# Patient Record
Sex: Female | Born: 1980 | Race: White | Hispanic: No | Marital: Married | Smoking: Never smoker
Health system: Southern US, Community
[De-identification: ages and names within clinical notes are randomized; demographics above are authoritative.]

## PROBLEM LIST (undated history)

## (undated) DIAGNOSIS — G5600 Carpal tunnel syndrome, unspecified upper limb: Secondary | ICD-10-CM

## (undated) DIAGNOSIS — F32A Depression, unspecified: Secondary | ICD-10-CM

## (undated) DIAGNOSIS — F329 Major depressive disorder, single episode, unspecified: Secondary | ICD-10-CM

## (undated) DIAGNOSIS — M779 Enthesopathy, unspecified: Secondary | ICD-10-CM

## (undated) DIAGNOSIS — M4300 Spondylolysis, site unspecified: Secondary | ICD-10-CM

## (undated) DIAGNOSIS — S0291XA Unspecified fracture of skull, initial encounter for closed fracture: Secondary | ICD-10-CM

## (undated) DIAGNOSIS — G43909 Migraine, unspecified, not intractable, without status migrainosus: Secondary | ICD-10-CM

## (undated) DIAGNOSIS — M791 Myalgia, unspecified site: Secondary | ICD-10-CM

## (undated) DIAGNOSIS — N200 Calculus of kidney: Secondary | ICD-10-CM

## (undated) DIAGNOSIS — F419 Anxiety disorder, unspecified: Secondary | ICD-10-CM

## (undated) DIAGNOSIS — G629 Polyneuropathy, unspecified: Secondary | ICD-10-CM

## (undated) DIAGNOSIS — M797 Fibromyalgia: Secondary | ICD-10-CM

## (undated) DIAGNOSIS — M5136 Other intervertebral disc degeneration, lumbar region: Secondary | ICD-10-CM

---

## 2015-11-17 ENCOUNTER — Emergency Department (HOSPITAL_COMMUNITY)
Admission: EM | Admit: 2015-11-17 | Discharge: 2015-11-17 | Disposition: A | Discharge status: Home / Self Care | Payer: Medicaid Other | Attending: Emergency Medicine | Admitting: Emergency Medicine

## 2015-11-17 ENCOUNTER — Encounter (HOSPITAL_COMMUNITY): Payer: Self-pay | Admitting: *Deleted

## 2015-11-17 DIAGNOSIS — M25562 Pain in left knee: Secondary | ICD-10-CM | POA: Insufficient documentation

## 2015-11-17 DIAGNOSIS — F329 Major depressive disorder, single episode, unspecified: Secondary | ICD-10-CM | POA: Diagnosis not present

## 2015-11-17 DIAGNOSIS — M79605 Pain in left leg: Secondary | ICD-10-CM | POA: Diagnosis present

## 2015-11-17 HISTORY — DX: Carpal tunnel syndrome, unspecified upper limb: G56.00

## 2015-11-17 HISTORY — DX: Enthesopathy, unspecified: M77.9

## 2015-11-17 HISTORY — DX: Other intervertebral disc degeneration, lumbar region: M51.36

## 2015-11-17 HISTORY — DX: Myalgia, unspecified site: M79.10

## 2015-11-17 HISTORY — DX: Fibromyalgia: M79.7

## 2015-11-17 HISTORY — DX: Major depressive disorder, single episode, unspecified: F32.9

## 2015-11-17 HISTORY — DX: Polyneuropathy, unspecified: G62.9

## 2015-11-17 HISTORY — DX: Migraine, unspecified, not intractable, without status migrainosus: G43.909

## 2015-11-17 HISTORY — DX: Depression, unspecified: F32.A

## 2015-11-17 HISTORY — DX: Spondylolysis, site unspecified: M43.00

## 2015-11-17 HISTORY — DX: Anxiety disorder, unspecified: F41.9

## 2015-11-17 MED ORDER — PREDNISONE 20 MG PO TABS: ORAL_TABLET | ORAL | Status: AC

## 2015-11-17 NOTE — Discharge Instructions (Signed)
Please follow up with your primary care provider for further management of your condition.  Take steroids as prescribed.  Return to the ER if you develop fever, numbness, rash on your leg or if you have other concerns.

## 2015-11-17 NOTE — ED Notes (Signed)
Pt states that she has neuropathy and fibromyalgia; pt states that she has had increased pain to her left leg for 2 days; pt states that she has taken her muscle relaxers, ibuprofen, gabapentin and lorazepam without relief; pt states that she has burning pain from her hip to her knee; pt states that the pain is constant but has been worse over the last 2 days and she states "I can't get it to calm down"; pt denies injury to leg; no redness no swelling noted

## 2015-11-17 NOTE — ED Provider Notes (Signed)
CSN: [id number]     Arrival date & time 11/17/15  2000 History  By signing my name below, I, Mackenzie Sawyer, attest that this documentation has been prepared under the direction and in the presence of Fayrene HelperBowie Benz Vandenberghe, PA-C Electronically Signed: Soijett Sawyer, ED Scribe. 11/17/2015. 8:28 PM.  Chief Complaint  Patient presents with  . Leg Pain      The history is provided by the patient. No language interpreter was used.    Mackenzie Sawyer is a 35 y.o. female with a medical hx of fibromyalgia, neuropathy, who presents to the Emergency Department complaining of constant left leg pain onset 1 week worsening 2 days ago. She notes that she will have flare ups of her fibromyalgia and neuropathy that she is unable to get to stop sometimes. Pt states that she has a burning sensation to her left thigh that radiates from her left hip to her left toes. Pt reports that she thinks that there is fluid to her left knee. Pt reports that her pain is more in her nerves and her pain is worsened with movement. Pt states that when her fibromyalgia and neuropathy flares up she goes to her doctor she is given a steroid and oxycodone for her symptoms. Pt is having associated symptoms of migraine x yesterday and vomiting. She notes that she has tried rest, ice, ibuprofen, tylenol, gabapentin, lorazepam, with no relief of her symptoms. She denies fever, bowel/bladder incontinence, and any other symptoms. Denies having a PCP at this time but has an appointment with a PCP on 5/31/017. Denies PMHx of IV drug use or CA.    Past Medical History  Diagnosis Date  . Fibromyalgia   . Neuropathy (HCC)   . Depression   . DDD (degenerative disc disease), lumbar   . Spondylolysis   . Anxiety   . Tendonitis   . Carpal tunnel syndrome   . Migraines   . Muscle discomfort     to neck   History reviewed. No pertinent past surgical history. No family history on file. Social History  Substance Use Topics  . Smoking status: Never Smoker    . Smokeless tobacco: None  . Alcohol Use: No   OB History    No data available     Review of Systems  Constitutional: Negative for fever.  Gastrointestinal:       No bowel incontinence  Genitourinary:       No bladder incontinence  Musculoskeletal: Positive for arthralgias. Negative for joint swelling.  Skin: Negative for color change and wound.  All other systems reviewed and are negative.     Allergies  Vancomycin and Tramadol  Home Medications   Prior to Admission medications   Not on File   BP 128/73 mmHg  Pulse 99  Temp(Src) 98.3 F (36.8 C) (Oral)  Resp 18  SpO2 99%  LMP 10/29/2015 Physical Exam  Constitutional: She is oriented to person, place, and time. She appears well-developed and well-nourished. No distress.  HENT:  Head: Normocephalic and atraumatic.  Eyes: EOM are normal.  Neck: Neck supple.  Cardiovascular: Normal rate.   Pulmonary/Chest: Effort normal. No respiratory distress.  Abdominal: She exhibits no distension.  Musculoskeletal: Normal range of motion.  No significant midline spinal tenderness, crepitus, or step-offs. BLE without palpable cords, edema, or erythema. Tenderness noted to the left knee at medial and lateral joint line. No crepitus or deformity. No significant swelling. Able to ambulate without difficulty or assistance.   Neurological: She is alert and oriented  to person, place, and time.  Patellar DTRs intact bilaterally.  Skin: Skin is warm and dry.  Psychiatric: She has a normal mood and affect. Her behavior is normal.  Nursing note and vitals reviewed.   ED Course  Procedures (including critical care time) DIAGNOSTIC STUDIES: Oxygen Saturation is 99% on RA, nl by my interpretation.    COORDINATION OF CARE: 8:26 PM Discussed treatment plan with pt at bedside which includes prednisone and pt agreed to plan.    MDM   Final diagnoses:  Left leg pain   BP 128/73 mmHg  Pulse 99  Temp(Src) 98.3 F (36.8 C) (Oral)   Resp 18  SpO2 99%  LMP 10/29/2015   Acute on chronic leg pain. No red flags. Low suspicion for DVT. Pt is NVI. No signs to suggest septic arthritis. Will Rx short course of steroid and pt will follow up with her PCP next week as previously scheduled for further care.   I personally performed the services described in this documentation, which was scribed in my presence. The recorded information has been reviewed and is accurate.      Fayrene Helper, PA-C 11/17/15 2033  Linwood Dibbles, MD 11/18/15 [phone]

## 2016-04-03 ENCOUNTER — Encounter (HOSPITAL_COMMUNITY): Payer: Self-pay | Admitting: Emergency Medicine

## 2016-04-03 ENCOUNTER — Emergency Department (HOSPITAL_COMMUNITY): Payer: Medicaid Other

## 2016-04-03 ENCOUNTER — Emergency Department (HOSPITAL_COMMUNITY)
Admission: EM | Admit: 2016-04-03 | Discharge: 2016-04-03 | Disposition: A | Discharge status: Home / Self Care | Payer: Medicaid Other | Attending: Emergency Medicine | Admitting: Emergency Medicine

## 2016-04-03 DIAGNOSIS — N134 Hydroureter: Secondary | ICD-10-CM | POA: Diagnosis not present

## 2016-04-03 DIAGNOSIS — Z79899 Other long term (current) drug therapy: Secondary | ICD-10-CM | POA: Diagnosis not present

## 2016-04-03 DIAGNOSIS — N200 Calculus of kidney: Secondary | ICD-10-CM | POA: Diagnosis not present

## 2016-04-03 DIAGNOSIS — R1031 Right lower quadrant pain: Secondary | ICD-10-CM | POA: Diagnosis present

## 2016-04-03 LAB — CBC
HEMATOCRIT: 39.7 % (ref 36.0–46.0)
HEMOGLOBIN: 13.8 g/dL (ref 12.0–15.0)
MCH: 29.4 pg (ref 26.0–34.0)
MCHC: 34.8 g/dL (ref 30.0–36.0)
MCV: 84.6 fL (ref 78.0–100.0)
Platelets: 271 10*3/uL (ref 150–400)
RBC: 4.69 MIL/uL (ref 3.87–5.11)
RDW: 12.8 % (ref 11.5–15.5)
WBC: 11.7 10*3/uL — AB (ref 4.0–10.5)

## 2016-04-03 LAB — URINALYSIS, ROUTINE W REFLEX MICROSCOPIC
Bilirubin Urine: NEGATIVE
Glucose, UA: NEGATIVE mg/dL
Ketones, ur: NEGATIVE mg/dL
NITRITE: NEGATIVE
PH: 6 (ref 5.0–8.0)
Protein, ur: NEGATIVE mg/dL
SPECIFIC GRAVITY, URINE: 1.024 (ref 1.005–1.030)

## 2016-04-03 LAB — COMPREHENSIVE METABOLIC PANEL
ALBUMIN: 4.7 g/dL (ref 3.5–5.0)
ALT: 19 U/L (ref 14–54)
ANION GAP: 9 (ref 5–15)
AST: 21 U/L (ref 15–41)
Alkaline Phosphatase: 50 U/L (ref 38–126)
BILIRUBIN TOTAL: 0.8 mg/dL (ref 0.3–1.2)
BUN: 18 mg/dL (ref 6–20)
CHLORIDE: 108 mmol/L (ref 101–111)
CO2: 21 mmol/L — ABNORMAL LOW (ref 22–32)
Calcium: 9.6 mg/dL (ref 8.9–10.3)
Creatinine, Ser: 0.76 mg/dL (ref 0.44–1.00)
GFR calc Af Amer: >60 mL/min (ref >60)
GFR calc non Af Amer: >60 mL/min (ref >60)
GLUCOSE: 93 mg/dL (ref 65–99)
POTASSIUM: 2.9 mmol/L — AB (ref 3.5–5.1)
Sodium: 138 mmol/L (ref 135–145)
TOTAL PROTEIN: 7.9 g/dL (ref 6.5–8.1)

## 2016-04-03 LAB — LIPASE, BLOOD: LIPASE: 26 U/L (ref 11–51)

## 2016-04-03 LAB — POC URINE PREG, ED: Preg Test, Ur: NEGATIVE

## 2016-04-03 LAB — URINE MICROSCOPIC-ADD ON

## 2016-04-03 MED ORDER — POTASSIUM CHLORIDE CRYS ER 20 MEQ PO TBCR
40.0000 meq | EXTENDED_RELEASE_TABLET | Freq: Once | ORAL | Status: AC
  Administered 2016-04-03: 40 meq via ORAL
  Filled 2016-04-03: qty 2

## 2016-04-03 MED ORDER — IOPAMIDOL (ISOVUE-300) INJECTION 61%: 100.0000 mL | Freq: Once | INTRAVENOUS | Status: DC | PRN

## 2016-04-03 MED ORDER — IBUPROFEN 600 MG PO TABS: 600.0000 mg | ORAL_TABLET | Freq: Four times a day (QID) | ORAL | 0 refills | Status: AC | PRN

## 2016-04-03 MED ORDER — MORPHINE SULFATE (PF) 4 MG/ML IV SOLN
4.0000 mg | Freq: Once | INTRAVENOUS | Status: AC
  Administered 2016-04-03: 4 mg via INTRAVENOUS
  Filled 2016-04-03: qty 1

## 2016-04-03 MED ORDER — ONDANSETRON 4 MG PO TBDP: 4.0000 mg | ORAL_TABLET | ORAL | 0 refills | Status: AC | PRN

## 2016-04-03 MED ORDER — OXYCODONE-ACETAMINOPHEN 5-325 MG PO TABS: 2.0000 | ORAL_TABLET | ORAL | 0 refills | Status: AC | PRN

## 2016-04-03 MED ORDER — IOPAMIDOL (ISOVUE-300) INJECTION 61%
100.0000 mL | Freq: Once | INTRAVENOUS | Status: AC | PRN
  Administered 2016-04-03: 100 mL via INTRAVENOUS

## 2016-04-03 MED ORDER — ONDANSETRON HCL 4 MG/2ML IJ SOLN
4.0000 mg | Freq: Once | INTRAMUSCULAR | Status: AC
  Administered 2016-04-03: 4 mg via INTRAVENOUS
  Filled 2016-04-03: qty 2

## 2016-04-03 MED ORDER — TAMSULOSIN HCL 0.4 MG PO CAPS
0.4000 mg | ORAL_CAPSULE | Freq: Once | ORAL | Status: AC
  Administered 2016-04-03: 0.4 mg via ORAL
  Filled 2016-04-03: qty 1

## 2016-04-03 MED ORDER — SODIUM CHLORIDE 0.9 % IV SOLN
INTRAVENOUS | Status: DC
  Administered 2016-04-03: 20:00:00 via INTRAVENOUS

## 2016-04-03 MED ORDER — TAMSULOSIN HCL 0.4 MG PO CAPS: 0.4000 mg | ORAL_CAPSULE | Freq: Every day | ORAL | 0 refills | Status: AC

## 2016-04-03 MED ORDER — OXYCODONE-ACETAMINOPHEN 5-325 MG PO TABS
2.0000 | ORAL_TABLET | Freq: Once | ORAL | Status: AC
  Administered 2016-04-03: 2 via ORAL
  Filled 2016-04-03: qty 2

## 2016-04-03 NOTE — ED Provider Notes (Signed)
[facility]-EMERGENCY DEPT Provider Note   CSN: [id number] Arrival date & time: 04/03/16  1750     History   Chief Complaint Chief Complaint  Patient presents with  . Abdominal Pain    HPI Mackenzie Sawyer is a 35 y.o. female.  HPI Right lower quadrant pain since 1 AM. Aching and cramping in nature. Worse with movements. Radiation into her lower back. Patient reports she spent the day lying in a ball. He reports vomiting 2. No constipation. No pain burning or urgency of urination. She reports her urine has been dark. No abnormal vaginal bleeding or discharge. Past Medical History:  Diagnosis Date  . Anxiety   . Carpal tunnel syndrome   . DDD (degenerative disc disease), lumbar   . Depression   . Fibromyalgia   . Migraines   . Muscle discomfort    to neck  . Neuropathy (HCC)   . Spondylolysis   . Tendonitis     There are no active problems to display for this patient.   History reviewed. No pertinent surgical history.  OB History    No data available       Home Medications    Prior to Admission medications   Medication Sig Start Date End Date Taking? Authorizing Provider  ADVAIR DISKUS 250-50 MCG/DOSE AEPB Inhale 1 puff into the lungs 2 (two) times daily as needed for shortness of breath or wheezing. 02/23/16  Yes Historical Provider, MD  buPROPion (WELLBUTRIN SR) 200 MG 12 hr tablet Take 200 mg by mouth 2 (two) times daily. 03/05/16  Yes Historical Provider, MD  DULoxetine (CYMBALTA) 60 MG capsule Take 60 mg by mouth at bedtime. 03/07/16  Yes Historical Provider, MD  LORazepam (ATIVAN) 0.5 MG tablet Take 0.5 mg by mouth 3 (three) times daily as needed for anxiety. 03/07/16  Yes Historical Provider, MD  LYRICA 75 MG capsule Take 75 mg by mouth 3 (three) times daily.  03/07/16  Yes Historical Provider, MD  meloxicam (MOBIC) 7.5 MG tablet Take 7.5 mg by mouth 2 (two) times daily.   Yes Historical Provider, MD  predniSONE (DELTASONE) 20 MG tablet Take 20 mg by mouth at bedtime.    Yes Historical Provider, MD  PROAIR HFA 108 [phone] Base) MCG/ACT inhaler Inhale 2 puffs into the lungs every 6 (six) hours as needed for shortness of breath or wheezing. 03/07/16  Yes Historical Provider, MD  SUMAtriptan (IMITREX) 25 MG tablet Take 25 mg by mouth 4 (four) times daily as needed for migraine (may repeat after 2 hours of initial dose). Max 4 tabs/24 hours 01/29/16  Yes Historical Provider, MD  tiZANidine (ZANAFLEX) 4 MG tablet Take 4 mg by mouth at bedtime.  03/07/16  Yes Historical Provider, MD  topiramate (TOPAMAX) 100 MG tablet Take 100 mg by mouth 2 (two) times daily.   Yes Historical Provider, MD  ibuprofen (ADVIL,MOTRIN) 600 MG tablet Take 1 tablet (600 mg total) by mouth every 6 (six) hours as needed. 04/03/16   Arby Barrette, MD  ondansetron (ZOFRAN ODT) 4 MG disintegrating tablet Take 1 tablet (4 mg total) by mouth every 4 (four) hours as needed for nausea or vomiting. 04/03/16   Arby Barrette, MD  oxyCODONE-acetaminophen (PERCOCET) 5-325 MG tablet Take 2 tablets by mouth every 4 (four) hours as needed. 04/03/16   Arby Barrette, MD  predniSONE (DELTASONE) 20 MG tablet 3 tabs po day one, then 2 tabs daily x 4 days Patient not taking: Reported on 04/03/2016 11/17/15   Fayrene Helper, PA-C  tamsulosin (  FLOMAX) 0.4 MG CAPS capsule Take 1 capsule (0.4 mg total) by mouth daily. 04/03/16   Arby Barrette, MD    Family History History reviewed. No pertinent family history.  Social History Social History  Substance Use Topics  . Smoking status: Never Smoker  . Smokeless tobacco: Never Used  . Alcohol use No     Allergies   Baclofen; Vancomycin; and Tramadol   Review of Systems Review of Systems 10 Systems reviewed and are negative for acute change except as noted in the HPI.   Physical Exam Updated Vital Signs BP 121/79 (BP Location: Right Arm)   Pulse 88   Temp 98.4 F (36.9 C) (Oral)   Resp 17   Ht 5\' 4"  (1.626 m)   Wt 183 lb (83 kg)   LMP 03/13/2016 Comment: Upreg  neg 04/03/16  SpO2 99%   BMI 31.41 kg/m   Physical Exam  Constitutional: She appears well-developed and well-nourished. No distress.  HENT:  Head: Normocephalic and atraumatic.  Mouth/Throat: Oropharynx is clear and moist.  Eyes: Conjunctivae and EOM are normal.  Neck: Neck supple.  Cardiovascular: Normal rate and regular rhythm.   No murmur heard. Pulmonary/Chest: Effort normal and breath sounds normal. No respiratory distress.  Abdominal: Soft. There is tenderness.  Right lower and mid quadrant tenderness to palpation. No guarding.  Musculoskeletal: She exhibits no edema, tenderness or deformity.  Neurological: She is alert. No cranial nerve deficit. She exhibits normal muscle tone. Coordination normal.  Skin: Skin is warm and dry.  Psychiatric: She has a normal mood and affect.  Nursing note and vitals reviewed.    ED Treatments / Results  Labs (all labs ordered are listed, but only abnormal results are displayed) Labs Reviewed  COMPREHENSIVE METABOLIC PANEL - Abnormal; Notable for the following:       Result Value   Potassium 2.9 (*)    CO2 21 (*)    All other components within normal limits  CBC - Abnormal; Notable for the following:    WBC 11.7 (*)    All other components within normal limits  URINALYSIS, ROUTINE W REFLEX MICROSCOPIC (NOT AT [facility]) - Abnormal; Notable for the following:    APPearance CLOUDY (*)    Hgb urine dipstick MODERATE (*)    Leukocytes, UA TRACE (*)    All other components within normal limits  URINE MICROSCOPIC-ADD ON - Abnormal; Notable for the following:    Squamous Epithelial / LPF 0-5 (*)    Bacteria, UA RARE (*)    All other components within normal limits  LIPASE, BLOOD  POC URINE PREG, ED    EKG  EKG Interpretation None       Radiology Ct Abdomen Pelvis W Contrast  Result Date: 04/03/2016 CLINICAL DATA:  Acute onset of right upper quadrant abdominal pain and vomiting. Initial encounter. EXAM: CT ABDOMEN AND PELVIS WITH  CONTRAST TECHNIQUE: Multidetector CT imaging of the abdomen and pelvis was performed using the standard protocol following bolus administration of intravenous contrast. CONTRAST:  ISOVUE-300 IOPAMIDOL (ISOVUE-300) INJECTION 61% COMPARISON:  None. FINDINGS: Lower chest: Minimal bibasilar atelectasis is noted. The visualized portions of the mediastinum are unremarkable Hepatobiliary: The liver is unremarkable in appearance. The gallbladder is unremarkable in appearance. The common bile duct remains normal in caliber. Pancreas: The pancreas is within normal limits. Spleen: The spleen is unremarkable in appearance. Adrenals/Urinary Tract: The adrenal glands are unremarkable in appearance. There is moderate to severe right-sided hydronephrosis, with diffuse dilatation of the right ureter. This  reflects a large obstructing 9 x 6 mm stone at the distal right ureter, just above the right vesicoureteral junction. No perinephric stranding is seen. No nonobstructing renal stones are identified. The left kidney is unremarkable in appearance. Stomach/Bowel: The stomach is unremarkable in appearance. The small bowel is within normal limits. The appendix is normal in caliber, without evidence of appendicitis. The colon is unremarkable in appearance. Vascular/Lymphatic: The abdominal aorta is unremarkable in appearance. The inferior vena cava is grossly unremarkable. No retroperitoneal lymphadenopathy is seen. No pelvic sidewall lymphadenopathy is identified. Reproductive: The bladder is decompressed and not well assessed. The uterus is unremarkable in appearance. The ovaries are relatively symmetric. No suspicious adnexal masses are seen. Other: No additional soft tissue abnormalities are seen. Musculoskeletal: No acute osseous abnormalities are identified. The visualized musculature is unremarkable in appearance. IMPRESSION: Moderate to severe right-sided hydronephrosis, with diffuse dilatation of the right ureter. This  reflects a large obstructing 9 x 6 mm stone at the distal right ureter, just above the right vesicoureteral junction. Electronically Signed   By: Roanna RaiderJeffery  Chang M.D.   On: 04/03/2016 20:06    Procedures Procedures (including critical care time)  Medications Ordered in ED Medications  iopamidol (ISOVUE-300) 61 % injection 100 mL (not administered)  0.9 %  sodium chloride infusion ( Intravenous New Bag/Given 04/03/16 2028)  iopamidol (ISOVUE-300) 61 % injection 100 mL (100 mLs Intravenous Contrast Given 04/03/16 1924)  potassium chloride SA (K-DUR,KLOR-CON) CR tablet 40 mEq (40 mEq Oral Given 04/03/16 2018)  morphine 4 MG/ML injection 4 mg (4 mg Intravenous Given 04/03/16 2028)  oxyCODONE-acetaminophen (PERCOCET/ROXICET) 5-325 MG per tablet 2 tablet (2 tablets Oral Given 04/03/16 2125)  tamsulosin (FLOMAX) capsule 0.4 mg (0.4 mg Oral Given 04/03/16 2125)  ondansetron (ZOFRAN) injection 4 mg (4 mg Intravenous Given 04/03/16 2126)     Initial Impression / Assessment and Plan / ED Course  I have reviewed the triage vital signs and the nursing notes.  Pertinent labs & imaging results that were available during my care of the patient were reviewed by me and considered in my medical decision making (see chart for details).  Clinical Course   Consult: Reviewed with Dr. Thea SilversmithMackenzie of urology. He reviewed CT and felt that the stone would possibly pass spontaneously. He recommends for pain control and the patient to strain urine.  Final Clinical Impressions(s) / ED Diagnoses   Final diagnoses:  Kidney stone  Hydroureter on right  Patient developed fairly sudden onset of right lateral and lower abdominal pain. CT confirms kidney stone with hydronephrosis. Patient has adequate pain control emergency department with morphine and oral Percocet. She has not had any further vomiting. She feels comfortable taking pain medications at home and tried to pass stone. She is aware follow-up plan and return  precautions.  New Prescriptions New Prescriptions   IBUPROFEN (ADVIL,MOTRIN) 600 MG TABLET    Take 1 tablet (600 mg total) by mouth every 6 (six) hours as needed.   ONDANSETRON (ZOFRAN ODT) 4 MG DISINTEGRATING TABLET    Take 1 tablet (4 mg total) by mouth every 4 (four) hours as needed for nausea or vomiting.   OXYCODONE-ACETAMINOPHEN (PERCOCET) 5-325 MG TABLET    Take 2 tablets by mouth every 4 (four) hours as needed.   TAMSULOSIN (FLOMAX) 0.4 MG CAPS CAPSULE    Take 1 capsule (0.4 mg total) by mouth daily.     Arby BarretteMarcy Brycelyn Gambino, MD 04/03/16 2225

## 2016-04-03 NOTE — ED Notes (Signed)
In Ct  

## 2016-04-03 NOTE — ED Notes (Signed)
PT treated for UTI about 3-4 weeks ago and now having severe abd pain in RLQ that started about 0100 this am and pt sts having N/V denies diarrhea.  Pt alert and oriented.  Pt sts pain is worse when moving around.

## 2016-04-03 NOTE — ED Triage Notes (Signed)
Pt reports sharp RUQ pain that increases with movement x1 day. Pt reports vomiting but denies diarrhea and fevers.

## 2016-04-03 NOTE — ED Notes (Signed)
provided a strainer and cup per provider request  edu pt to return to Ed if she experiences fever, vomiting and intractable pain  Pt verbalized understanding

## 2016-04-10 ENCOUNTER — Emergency Department (HOSPITAL_COMMUNITY)
Admission: EM | Admit: 2016-04-10 | Discharge: 2016-04-10 | Disposition: A | Discharge status: Home / Self Care | Payer: Medicaid Other | Attending: Emergency Medicine | Admitting: Emergency Medicine

## 2016-04-10 ENCOUNTER — Emergency Department (HOSPITAL_COMMUNITY): Payer: Medicaid Other

## 2016-04-10 ENCOUNTER — Encounter (HOSPITAL_COMMUNITY): Payer: Self-pay

## 2016-04-10 DIAGNOSIS — N202 Calculus of kidney with calculus of ureter: Secondary | ICD-10-CM | POA: Diagnosis not present

## 2016-04-10 DIAGNOSIS — Z79899 Other long term (current) drug therapy: Secondary | ICD-10-CM | POA: Diagnosis not present

## 2016-04-10 DIAGNOSIS — R109 Unspecified abdominal pain: Secondary | ICD-10-CM | POA: Diagnosis present

## 2016-04-10 DIAGNOSIS — N200 Calculus of kidney: Secondary | ICD-10-CM

## 2016-04-10 HISTORY — DX: Calculus of kidney: N20.0

## 2016-04-10 HISTORY — DX: Unspecified fracture of skull, initial encounter for closed fracture: S02.91XA

## 2016-04-10 LAB — URINALYSIS, ROUTINE W REFLEX MICROSCOPIC
BILIRUBIN URINE: NEGATIVE
GLUCOSE, UA: NEGATIVE mg/dL
KETONES UR: NEGATIVE mg/dL
NITRITE: NEGATIVE
PH: 6.5 (ref 5.0–8.0)
Protein, ur: NEGATIVE mg/dL
Specific Gravity, Urine: 1.02 (ref 1.005–1.030)

## 2016-04-10 LAB — URINE MICROSCOPIC-ADD ON

## 2016-04-10 LAB — POC URINE PREG, ED: PREG TEST UR: NEGATIVE

## 2016-04-10 MED ORDER — ONDANSETRON HCL 4 MG/2ML IJ SOLN
4.0000 mg | Freq: Once | INTRAMUSCULAR | Status: AC
  Administered 2016-04-10: 4 mg via INTRAVENOUS
  Filled 2016-04-10: qty 2

## 2016-04-10 MED ORDER — HYDROMORPHONE HCL 1 MG/ML IJ SOLN
1.0000 mg | Freq: Once | INTRAMUSCULAR | Status: AC
  Administered 2016-04-10: 1 mg via INTRAVENOUS
  Filled 2016-04-10: qty 1

## 2016-04-10 NOTE — ED Triage Notes (Signed)
PT C/O CONTINUED RIGHT FLANK PAIN RADIATING TO THE RLQ SINCE LAST Thursday. PT STS SHE WAS DX'D WITH A KIDNEY STONE LAST Thursday, BUT THE PAIN HAS GOTTEN WORSE. PT STS SHE DOESN'T THINK SHE CAN PASS IT ON HER OWN. DENIES FEVER OR CHILLS.

## 2016-04-10 NOTE — ED Provider Notes (Signed)
[facility]-EMERGENCY DEPT Provider Note   CSN: [id number] Arrival date & time: 04/10/16  1757     History   Chief Complaint Chief Complaint  Patient presents with  . Flank Pain    RIGHT    HPI Mackenzie Sawyer is a 35 y.o. female.  HPI   35 year old female with history of fibromyalgia, anxiety, kidney stone presenting with complaints of right flank pain. For the past week patient has had pain to her right flank. She was seen and evaluated 7 days ago for her pain. CT scan demonstrating a 9 x 6 mm that was large at the right distal ureter just above the UVJ. Urologist was consult at that time and pt was certainly discharge with pain medication and Flomax. Patient continues to endorse same pain that has not improved. Her pain is worsen, radiates towards the right groin. She endorse nausea with occasional vomiting due to the pain. She denies any fever, chills, burning urination or hematuria. She did attempt to contact her urologist to try to set up an appointment had to cancel because it was on the date she had other appointments. Patient fell she is having a difficult time passing the stone and decided to come here for further evaluation. She has been taking the pain medication and Flomax which provide some relief. She does have a pain specialist.  Past Medical History:  Diagnosis Date  . Anxiety   . Carpal tunnel syndrome   . DDD (degenerative disc disease), lumbar   . Depression   . Fibromyalgia   . Kidney stone   . Migraines   . Muscle discomfort    to neck  . Neuropathy (HCC)   . Skull fracture (HCC)   . Spondylolysis   . Tendonitis     There are no active problems to display for this patient.   History reviewed. No pertinent surgical history.  OB History    No data available       Home Medications    Prior to Admission medications   Medication Sig Start Date End Date Taking? Authorizing Provider  ADVAIR DISKUS 250-50 MCG/DOSE AEPB Inhale 1 puff into the lungs 2 (two)  times daily as needed for shortness of breath or wheezing. 02/23/16  Yes Historical Provider, MD  buPROPion (WELLBUTRIN SR) 200 MG 12 hr tablet Take 200 mg by mouth 2 (two) times daily. 03/05/16  Yes Historical Provider, MD  DULoxetine (CYMBALTA) 60 MG capsule Take 60 mg by mouth daily.  03/07/16  Yes Historical Provider, MD  LORazepam (ATIVAN) 0.5 MG tablet Take 0.5 mg by mouth 3 (three) times daily as needed for anxiety. 03/07/16  Yes Historical Provider, MD  LYRICA 75 MG capsule Take 75 mg by mouth 3 (three) times daily.  03/07/16  Yes Historical Provider, MD  meloxicam (MOBIC) 7.5 MG tablet Take 7.5 mg by mouth 2 (two) times daily.   Yes Historical Provider, MD  oxyCODONE-acetaminophen (PERCOCET) 5-325 MG tablet Take 2 tablets by mouth every 4 (four) hours as needed. Patient taking differently: Take 2 tablets by mouth every 4 (four) hours as needed for moderate pain.  04/03/16  Yes Arby Barrette, MD  PROAIR HFA 108 [phone] Base) MCG/ACT inhaler Inhale 2 puffs into the lungs every 6 (six) hours as needed for shortness of breath or wheezing. 03/07/16  Yes Historical Provider, MD  SUMAtriptan (IMITREX) 25 MG tablet Take 25 mg by mouth 4 (four) times daily as needed for migraine (may repeat after 2 hours of initial dose). Max 4  tabs/24 hours 01/29/16  Yes Historical Provider, MD  tamsulosin (FLOMAX) 0.4 MG CAPS capsule Take 1 capsule (0.4 mg total) by mouth daily. 04/03/16  Yes Arby BarretteMarcy Pfeiffer, MD  tiZANidine (ZANAFLEX) 4 MG tablet Take 4 mg by mouth at bedtime.  03/07/16  Yes Historical Provider, MD  topiramate (TOPAMAX) 100 MG tablet Take 100 mg by mouth 2 (two) times daily.   Yes Historical Provider, MD  ibuprofen (ADVIL,MOTRIN) 600 MG tablet Take 1 tablet (600 mg total) by mouth every 6 (six) hours as needed. Patient not taking: Reported on 04/10/2016 04/03/16   Arby BarretteMarcy Pfeiffer, MD  ondansetron (ZOFRAN ODT) 4 MG disintegrating tablet Take 1 tablet (4 mg total) by mouth every 4 (four) hours as needed for nausea  or vomiting. Patient not taking: Reported on 04/10/2016 04/03/16   Arby BarretteMarcy Pfeiffer, MD  predniSONE (DELTASONE) 20 MG tablet 3 tabs po day one, then 2 tabs daily x 4 days Patient not taking: Reported on 04/03/2016 11/17/15   Fayrene HelperBowie Stacee Earp, PA-C    Family History History reviewed. No pertinent family history.  Social History Social History  Substance Use Topics  . Smoking status: Never Smoker  . Smokeless tobacco: Never Used  . Alcohol use No     Allergies   Baclofen; Vancomycin; and Tramadol   Review of Systems Review of Systems  All other systems reviewed and are negative.    Physical Exam Updated Vital Signs BP 106/77 (BP Location: Left Arm)   Pulse (!) 123   Temp 98.3 F (36.8 C) (Oral)   Resp 20   Ht 5\' 4"  (1.626 m)   Wt 81.6 kg   LMP 03/13/2016 Comment: Upreg neg 04/03/16  SpO2 99%   BMI 30.90 kg/m   Physical Exam  Constitutional: She appears well-developed and well-nourished. No distress.  HENT:  Head: Atraumatic.  Eyes: Conjunctivae are normal.  Neck: Neck supple.  Cardiovascular:  Tachycardia without murmurs rubs or gallops  Pulmonary/Chest: Effort normal and breath sounds normal.  Abdominal: Soft. There is tenderness (Tenderness to right side abdomen on palpation without guarding or rebound tenderness.).  Genitourinary:  Genitourinary Comments: Right CVA tenderness  Neurological: She is alert.  Skin: No rash noted.  Psychiatric: She has a normal mood and affect.  Nursing note and vitals reviewed.    ED Treatments / Results  Labs (all labs ordered are listed, but only abnormal results are displayed) Labs Reviewed  URINALYSIS, ROUTINE W REFLEX MICROSCOPIC (NOT AT [facility]) - Abnormal; Notable for the following:       Result Value   APPearance CLOUDY (*)    Hgb urine dipstick MODERATE (*)    Leukocytes, UA TRACE (*)    All other components within normal limits  URINE MICROSCOPIC-ADD ON - Abnormal; Notable for the following:    Squamous Epithelial /  LPF 0-5 (*)    Bacteria, UA FEW (*)    All other components within normal limits  POC URINE PREG, ED    EKG  EKG Interpretation None       Radiology Ct Renal Stone Study  Result Date: 04/10/2016 CLINICAL DATA:  Persistent right flank and lower quadrant pain for 1 week. Followup right ureteral calculus and hydronephrosis. EXAM: CT ABDOMEN AND PELVIS WITHOUT CONTRAST TECHNIQUE: Multidetector CT imaging of the abdomen and pelvis was performed following the standard protocol without IV contrast. COMPARISON:  04/03/2016 FINDINGS: Lower chest: No acute findings. Hepatobiliary: No mass visualized on this unenhanced exam. Mild hepatic steatosis again noted. Gallbladder is unremarkable. Pancreas: No mass  or inflammatory process visualized on this unenhanced exam. Spleen:  Within normal limits in size. Adrenals/Urinary tract: Normal adrenal glands. Several nonobstructive calculi again seen in mid and lower pole of right kidney. Severe right hydroureteronephrosis is again demonstrated with at least 2 calculi in the distal right ureter near the ureterovesical junction, largest measuring 6 mm. This shows no significant change in position since previous study. No bladder calculi identified. Stomach/Bowel: No evidence of obstruction, inflammatory process, or abnormal fluid collections. Normal appendix visualized. Vascular/Lymphatic: No pathologically enlarged lymph nodes identified. No evidence of abdominal aortic aneurysm. Reproductive:  No mass or other significant abnormality. Other:  None. Musculoskeletal:  No suspicious bone lesions identified. IMPRESSION: No significant change in severe right hydroureteronephrosis with at least 2 distal right ureteral calculi near the ureterovesical junction, largest measuring 6 mm. Right nephrolithiasis. Mild hepatic steatosis. Electronically Signed   By: Myles Rosenthal M.D.   On: 04/10/2016 20:51    Procedures Procedures (including critical care time)  Medications  Ordered in ED Medications - No data to display   Initial Impression / Assessment and Plan / ED Course  I have reviewed the triage vital signs and the nursing notes.  Pertinent labs & imaging results that were available during my care of the patient were reviewed by me and considered in my medical decision making (see chart for details).  Clinical Course    BP 103/69 (BP Location: Left Arm)   Pulse 88   Temp 98.5 F (36.9 C) (Oral)   Resp 18   Ht 5\' 4"  (1.626 m)   Wt 81.6 kg   LMP 03/13/2016 Comment: Upreg neg 04/03/16  SpO2 95%   BMI 30.90 kg/m    Final Clinical Impressions(s) / ED Diagnoses   Final diagnoses:  Kidney stone on right side    New Prescriptions New Prescriptions   No medications on file   7:43 PM Patient was diagnosed with a 9 x 6 mm kidney stone in the right side at the distal ureter above the UVJ approximately 1 week ago. She is here with persistent pain to the same location which has worsened and now spreading towards her abdomen. Suspect pain is from the retained stone. We'll repeat CT scan to assess the stone location. Pain medication given. Her UA did not show any signs of urinary tract infection. She is tachycardic likely secondary to pain. Pain medication given.  9:03 PM Repeat renal stone study demonstrates at least 2 ureteral stone in the R UVJ with severe hydroneprosis, appears to be in the same location.  I did discuss this finding with oncall urologist, Dr. Vernie Ammons, who recommend pt to call this office tomorrow for close follow up.  I will provide additional pain medication and will discharge pt appropriately.  Return precaution discussed.    Fayrene Helper, PA-C 04/10/16 2108    Vanetta Mulders, MD 04/11/16 2220

## 2016-04-10 NOTE — ED Notes (Signed)
Pt ambulatory and independent at discharge.  Verbalized understanding of discharge instructions 

## 2016-04-10 NOTE — Discharge Instructions (Signed)
Please call [facility] tomorrow for close follow up

## 2016-04-11 ENCOUNTER — Emergency Department (HOSPITAL_COMMUNITY)
Admission: EM | Admit: 2016-04-11 | Discharge: 2016-04-12 | Disposition: A | Discharge status: Home / Self Care | Payer: Medicaid Other | Attending: Emergency Medicine | Admitting: Emergency Medicine

## 2016-04-11 ENCOUNTER — Encounter (HOSPITAL_COMMUNITY): Payer: Self-pay | Admitting: Emergency Medicine

## 2016-04-11 DIAGNOSIS — N390 Urinary tract infection, site not specified: Secondary | ICD-10-CM | POA: Diagnosis not present

## 2016-04-11 DIAGNOSIS — Z79899 Other long term (current) drug therapy: Secondary | ICD-10-CM | POA: Insufficient documentation

## 2016-04-11 DIAGNOSIS — N200 Calculus of kidney: Secondary | ICD-10-CM | POA: Insufficient documentation

## 2016-04-11 LAB — URINALYSIS, ROUTINE W REFLEX MICROSCOPIC
BILIRUBIN URINE: NEGATIVE
Glucose, UA: NEGATIVE mg/dL
KETONES UR: NEGATIVE mg/dL
Nitrite: NEGATIVE
PROTEIN: 30 mg/dL — AB
SPECIFIC GRAVITY, URINE: 1.018 (ref 1.005–1.030)
pH: 6.5 (ref 5.0–8.0)

## 2016-04-11 LAB — URINE MICROSCOPIC-ADD ON

## 2016-04-11 MED ORDER — SULFAMETHOXAZOLE-TRIMETHOPRIM 800-160 MG PO TABS
1.0000 | ORAL_TABLET | Freq: Once | ORAL | Status: AC
  Administered 2016-04-11: 1 via ORAL
  Filled 2016-04-11: qty 1

## 2016-04-11 MED ORDER — ONDANSETRON 4 MG PO TBDP
4.0000 mg | ORAL_TABLET | Freq: Once | ORAL | Status: AC
  Administered 2016-04-11: 4 mg via ORAL
  Filled 2016-04-11: qty 1

## 2016-04-11 MED ORDER — HYDROMORPHONE HCL 1 MG/ML IJ SOLN
1.0000 mg | Freq: Once | INTRAMUSCULAR | Status: AC
  Administered 2016-04-11: 1 mg via INTRAMUSCULAR
  Filled 2016-04-11: qty 1

## 2016-04-11 MED ORDER — PHENAZOPYRIDINE HCL 100 MG PO TABS
100.0000 mg | ORAL_TABLET | Freq: Three times a day (TID) | ORAL | Status: DC
  Filled 2016-04-11: qty 1

## 2016-04-11 NOTE — Progress Notes (Signed)
Patient confirms her pcp is Dr. Greggory StallionGeorge Onsi-Bonsu.  System updated.

## 2016-04-11 NOTE — ED Triage Notes (Signed)
Pt was evaluated last night for increasing pain of a kidney stone.  Tonight around 1920 she states she passed half of a kidney stone.  After she passed it she said she has not been able to urinate since.  Patient moaning in pain during triage.

## 2016-04-11 NOTE — ED Provider Notes (Signed)
[facility]-EMERGENCY DEPT Provider Note   CSN: [id number] Arrival date & time: 04/11/16  1934  By signing my name below, I, Suzan Slick. Elon Spanner, attest that this documentation has been prepared under the direction and in the presence of Earley Favor, FNP.  Electronically Signed: Suzan Slick. Elon Spanner, ED Scribe. 04/11/16. 8:28 PM.   History   Chief Complaint Chief Complaint  Patient presents with  . Kidney Stone Pain    The history is provided by the patient. No language interpreter was used.    HPI Comments: Mackenzie Sawyer is a 35 y.o. female without any pertinent past medical history who presents to the Emergency Department complaining of intermittent, unchanged lower abdominal pain onset this evening. Pt states at approximately 1920 she noted she passed a known kidney stone. After passing stone, she states she has been unable to void. No aggravating or alleviating factors reported at this time. Prescribed Oxycodone and Flomax attempted at home for discomfort without any long term improvement. No recent fever or chills. Last normal menstrual period 9/21.  Pt was recently evaluated in the Emergency Department on 10/17 and 10/19 for similar. At time of initial visit, CT scan performed which revealed a 9 x 6 mm kidney stone with hydronephrosis. Pt was safely discharged home with instructions for pain control. However, pt returned 2 days later as pain did not improve. CT scan was repeated which revealed 2 uretal stones, however, was in same location. Pain was treated in the Emergency Department and she safely discharged home. Close follow up with Urologist recommended after time of discharge.    PCP: Jackie Plum, MD    Past Medical History:  Diagnosis Date  . Anxiety   . Carpal tunnel syndrome   . DDD (degenerative disc disease), lumbar   . Depression   . Fibromyalgia   . Kidney stone   . Migraines   . Muscle discomfort    to neck  . Neuropathy (HCC)   . Skull fracture (HCC)   . Spondylolysis    . Tendonitis     There are no active problems to display for this patient.   History reviewed. No pertinent surgical history.  OB History    No data available       Home Medications    Prior to Admission medications   Medication Sig Start Date End Date Taking? Authorizing Provider  ADVAIR DISKUS 250-50 MCG/DOSE AEPB Inhale 1 puff into the lungs 2 (two) times daily as needed for shortness of breath or wheezing. 02/23/16  Yes Historical Provider, MD  buPROPion (WELLBUTRIN SR) 200 MG 12 hr tablet Take 200 mg by mouth 2 (two) times daily. 03/05/16  Yes Historical Provider, MD  DULoxetine (CYMBALTA) 60 MG capsule Take 60 mg by mouth daily.  03/07/16  Yes Historical Provider, MD  LYRICA 75 MG capsule Take 75 mg by mouth 3 (three) times daily.  03/07/16  Yes Historical Provider, MD  oxyCODONE-acetaminophen (PERCOCET) 5-325 MG tablet Take 2 tablets by mouth every 4 (four) hours as needed. Patient taking differently: Take 2 tablets by mouth every 4 (four) hours as needed for moderate pain.  04/03/16  Yes Arby Barrette, MD  tamsulosin (FLOMAX) 0.4 MG CAPS capsule Take 1 capsule (0.4 mg total) by mouth daily. 04/03/16  Yes Arby Barrette, MD  tiZANidine (ZANAFLEX) 4 MG tablet Take 4 mg by mouth at bedtime.  03/07/16  Yes Historical Provider, MD  topiramate (TOPAMAX) 100 MG tablet Take 100 mg by mouth 2 (two) times daily.   Yes Historical  Provider, MD  ibuprofen (ADVIL,MOTRIN) 600 MG tablet Take 1 tablet (600 mg total) by mouth every 6 (six) hours as needed. Patient not taking: Reported on 04/11/2016 04/03/16   Arby BarretteMarcy Pfeiffer, MD  LORazepam (ATIVAN) 0.5 MG tablet Take 0.5 mg by mouth 3 (three) times daily as needed for anxiety. 03/07/16   Historical Provider, MD  ondansetron (ZOFRAN ODT) 4 MG disintegrating tablet Take 1 tablet (4 mg total) by mouth every 4 (four) hours as needed for nausea or vomiting. Patient not taking: Reported on 04/11/2016 04/03/16   Arby BarretteMarcy Pfeiffer, MD  phenazopyridine  (PYRIDIUM) 200 MG tablet Take 1 tablet (200 mg total) by mouth 3 (three) times daily. 04/12/16   Earley FavorGail Tandy Lewin, NP  predniSONE (DELTASONE) 20 MG tablet 3 tabs po day one, then 2 tabs daily x 4 days Patient not taking: Reported on 04/11/2016 11/17/15   Fayrene HelperBowie Tran, PA-C  PROAIR HFA 108 [phone](90 Base) MCG/ACT inhaler Inhale 2 puffs into the lungs every 6 (six) hours as needed for shortness of breath or wheezing. 03/07/16   Historical Provider, MD  sulfamethoxazole-trimethoprim (BACTRIM DS,SEPTRA DS) 800-160 MG tablet Take 1 tablet by mouth 2 (two) times daily. 04/12/16 04/19/16  Earley FavorGail Gram Siedlecki, NP  SUMAtriptan (IMITREX) 25 MG tablet Take 25 mg by mouth 4 (four) times daily as needed for migraine (may repeat after 2 hours of initial dose). Max 4 tabs/24 hours 01/29/16   Historical Provider, MD    Family History No family history on file.  Social History Social History  Substance Use Topics  . Smoking status: Never Smoker  . Smokeless tobacco: Never Used  . Alcohol use No     Allergies   Baclofen; Vancomycin; and Tramadol   Review of Systems Review of Systems  Constitutional: Negative for chills and fever.  Respiratory: Negative for shortness of breath.   Gastrointestinal: Positive for abdominal pain. Negative for nausea and vomiting.  Neurological: Negative for headaches.  Psychiatric/Behavioral: Negative for confusion.  All other systems reviewed and are negative.    Physical Exam Updated Vital Signs BP 109/79 (BP Location: Left Arm)   Pulse 94   Temp 98.6 F (37 C) (Oral)   Resp 18   Ht 5\' 4"  (1.626 m)   Wt 81.6 kg   LMP 03/13/2016 Comment: Upreg neg 04/03/16  SpO2 99%   BMI 30.90 kg/m   Physical Exam  Constitutional: She is oriented to person, place, and time. She appears well-developed and well-nourished. No distress.  HENT:  Head: Normocephalic and atraumatic.  Eyes: EOM are normal.  Neck: Normal range of motion.  Cardiovascular: Normal rate, regular rhythm and normal heart  sounds.   Pulmonary/Chest: Effort normal and breath sounds normal.  Abdominal: Soft. There is no tenderness.  Musculoskeletal: Normal range of motion.  Neurological: She is alert and oriented to person, place, and time.  Skin: Skin is warm and dry.  Psychiatric: She has a normal mood and affect. Judgment normal.  Nursing note and vitals reviewed.    ED Treatments / Results   DIAGNOSTIC STUDIES: Oxygen Saturation is 98% on RA, Normal by my interpretation.    COORDINATION OF CARE: 8:15 PM- Will order foley catheter placement with 100 CC flush. Will order urinalysis and treat pain with Dilaudid and Zofran. Discussed treatment plan with pt at bedside and pt agreed to plan.      Labs (all labs ordered are listed, but only abnormal results are displayed) Labs Reviewed  URINALYSIS, ROUTINE W REFLEX MICROSCOPIC (NOT AT [facility]) - Abnormal; Notable  for the following:       Result Value   Color, Urine AMBER (*)    APPearance CLOUDY (*)    Hgb urine dipstick LARGE (*)    Protein, ur 30 (*)    Leukocytes, UA SMALL (*)    All other components within normal limits  URINE MICROSCOPIC-ADD ON - Abnormal; Notable for the following:    Squamous Epithelial / LPF 0-5 (*)    Bacteria, UA MANY (*)    All other components within normal limits    EKG  EKG Interpretation None       Radiology Ct Renal Stone Study  Result Date: 04/10/2016 CLINICAL DATA:  Persistent right flank and lower quadrant pain for 1 week. Followup right ureteral calculus and hydronephrosis. EXAM: CT ABDOMEN AND PELVIS WITHOUT CONTRAST TECHNIQUE: Multidetector CT imaging of the abdomen and pelvis was performed following the standard protocol without IV contrast. COMPARISON:  04/03/2016 FINDINGS: Lower chest: No acute findings. Hepatobiliary: No mass visualized on this unenhanced exam. Mild hepatic steatosis again noted. Gallbladder is unremarkable. Pancreas: No mass or inflammatory process visualized on this unenhanced exam.  Spleen:  Within normal limits in size. Adrenals/Urinary tract: Normal adrenal glands. Several nonobstructive calculi again seen in mid and lower pole of right kidney. Severe right hydroureteronephrosis is again demonstrated with at least 2 calculi in the distal right ureter near the ureterovesical junction, largest measuring 6 mm. This shows no significant change in position since previous study. No bladder calculi identified. Stomach/Bowel: No evidence of obstruction, inflammatory process, or abnormal fluid collections. Normal appendix visualized. Vascular/Lymphatic: No pathologically enlarged lymph nodes identified. No evidence of abdominal aortic aneurysm. Reproductive:  No mass or other significant abnormality. Other:  None. Musculoskeletal:  No suspicious bone lesions identified. IMPRESSION: No significant change in severe right hydroureteronephrosis with at least 2 distal right ureteral calculi near the ureterovesical junction, largest measuring 6 mm. Right nephrolithiasis. Mild hepatic steatosis. Electronically Signed   By: Myles Rosenthal M.D.   On: 04/10/2016 20:51    Procedures Procedures (including critical care time)  Medications Ordered in ED Medications  phenazopyridine (PYRIDIUM) tablet 100 mg (not administered)  HYDROmorphone (DILAUDID) injection 1 mg (1 mg Intramuscular Given 04/11/16 2055)  ondansetron (ZOFRAN-ODT) disintegrating tablet 4 mg (4 mg Oral Given 04/11/16 2055)  sulfamethoxazole-trimethoprim (BACTRIM DS,SEPTRA DS) 800-160 MG per tablet 1 tablet (1 tablet Oral Given 04/11/16 2329)  oxyCODONE (Oxy IR/ROXICODONE) immediate release tablet 5 mg (5 mg Oral Given 04/12/16 0036)  ketorolac (TORADOL) injection 30 mg (30 mg Intramuscular Given 04/12/16 0037)     Initial Impression / Assessment and Plan / ED Course  I have reviewed the triage vital signs and the nursing notes.  Pertinent labs & imaging results that were available during my care of the patient were reviewed by me  and considered in my medical decision making (see chart for details).  Clinical Course    Reason kidney stone.  She says she felt pressure and pass the stone, but she feels like there is still a portion of it there.  She's tried pushing and pushing without any results, except to increase her abdominal pain. Others the nurse to place a Foley and irrigate possibly to move the stone around.  She is to obtain a urine sample prior to irrigation. Patient was found to have a urinary tract infection.  She is in started on Septra and Pyridium.  She's been given pain medication including oxycodone 5 mg by mouth as well as Toradol IM and  has had significant decrease in her pain.  She is able to urinate.  She has made contact with urology and is waiting for them to call back with an appointment for next week  Final Clinical Impressions(s) / ED Diagnoses   Final diagnoses:  Kidney stone  Urinary tract infection without hematuria, site unspecified    New Prescriptions New Prescriptions   PHENAZOPYRIDINE (PYRIDIUM) 200 MG TABLET    Take 1 tablet (200 mg total) by mouth 3 (three) times daily.   SULFAMETHOXAZOLE-TRIMETHOPRIM (BACTRIM DS,SEPTRA DS) 800-160 MG TABLET    Take 1 tablet by mouth 2 (two) times daily.   I personally performed the services described in this documentation, which was scribed in my presence. The recorded information has been reviewed and is accurate.   Earley Favor, NP 04/12/16 0147    Earley Favor, NP 04/12/16 1610    Lyndal Pulley, MD 04/12/16 9604

## 2016-04-11 NOTE — ED Notes (Signed)
Pt made aware of need for urine specimen 

## 2016-04-12 MED ORDER — OXYCODONE HCL 5 MG PO TABS
5.0000 mg | ORAL_TABLET | ORAL | Status: AC
  Administered 2016-04-12: 5 mg via ORAL
  Filled 2016-04-12: qty 1

## 2016-04-12 MED ORDER — KETOROLAC TROMETHAMINE 60 MG/2ML IM SOLN
30.0000 mg | INTRAMUSCULAR | Status: AC
  Administered 2016-04-12: 30 mg via INTRAMUSCULAR
  Filled 2016-04-12: qty 2

## 2016-04-12 MED ORDER — PHENAZOPYRIDINE HCL 200 MG PO TABS: 200.0000 mg | ORAL_TABLET | Freq: Three times a day (TID) | ORAL | 0 refills | Status: AC

## 2016-04-12 MED ORDER — SULFAMETHOXAZOLE-TRIMETHOPRIM 800-160 MG PO TABS: 1.0000 | ORAL_TABLET | Freq: Two times a day (BID) | ORAL | 0 refills | Status: AC

## 2016-04-12 NOTE — ED Notes (Signed)
folley d/c by charge nurse and pt urinated after.

## 2016-04-12 NOTE — Discharge Instructions (Signed)
You brought in a stone that you pass resistant significant insight 6 mm by 2 mm x 1 mm your assess for further discomfort.  He was found to have a urinary tract infection which is being treated with antibiotic.  It is very important that you take the antibiotic as directed until all tablets have been consumed.  You've also been given a medication called Pyridium, which does make your urine orange.  It is a bladder anesthetic and should help with the discomfort.  Continue taking Zofran and oxycodone at home for pain as needed.  Continue calling the urologist on a daily basis and continue to get an appointment.  Return anytime your pain becomes unbearable.  He developed fever, nausea, vomiting or worsening symptoms

## 2016-04-26 ENCOUNTER — Other Ambulatory Visit: Payer: Self-pay | Admitting: Family Medicine

## 2016-04-26 ENCOUNTER — Inpatient Hospital Stay (HOSPITAL_COMMUNITY): Admission: RE | Admit: 2016-04-26 | Payer: Medicaid Other | Source: Ambulatory Visit

## 2016-04-26 ENCOUNTER — Ambulatory Visit (HOSPITAL_COMMUNITY)
Admission: RE | Admit: 2016-04-26 | Discharge: 2016-04-26 | Disposition: A | Discharge status: Home / Self Care | Payer: Medicaid Other | Source: Ambulatory Visit | Attending: Family Medicine | Admitting: Family Medicine

## 2016-04-26 ENCOUNTER — Ambulatory Visit (HOSPITAL_COMMUNITY): Admission: RE | Admit: 2016-04-26 | Payer: Medicaid Other | Source: Ambulatory Visit

## 2016-04-26 DIAGNOSIS — G8929 Other chronic pain: Secondary | ICD-10-CM

## 2016-04-26 DIAGNOSIS — M545 Low back pain: Secondary | ICD-10-CM | POA: Insufficient documentation

## 2016-04-26 DIAGNOSIS — M542 Cervicalgia: Secondary | ICD-10-CM | POA: Insufficient documentation

## 2018-06-22 IMAGING — CT CT ABD-PELV W/ CM
2 of 4 series · 15 of 46 positions shown, 17 images · IV contrast (ISOVUE)
Comparison: None.

CLINICAL DATA: Acute onset of right upper quadrant abdominal pain
and vomiting. Initial encounter.

EXAM:
CT ABDOMEN AND PELVIS WITH CONTRAST
TECHNIQUE: Multidetector CT imaging of the abdomen and pelvis was performed
using the standard protocol following bolus administration of
intravenous contrast.
CONTRAST:  100mL [id number] IOPAMIDOL ([id number]) INJECTION 61%

[Series 2: abd/pel with · axial · 0.85mm/px · z∈[+388,+793]mm · 12 of 97 slices shown, 14 images]
[im 8/97  soft-tissue]
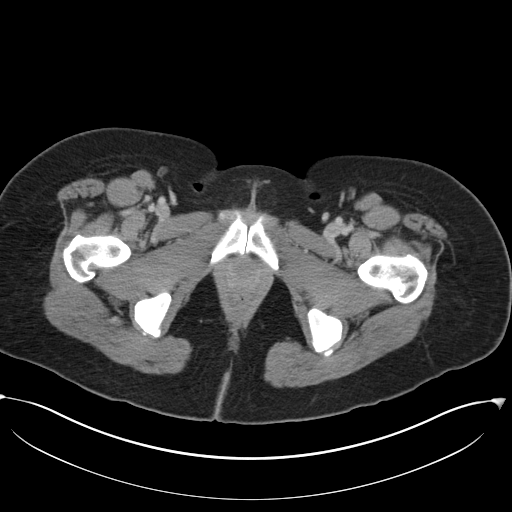
[im 8/97  bone]
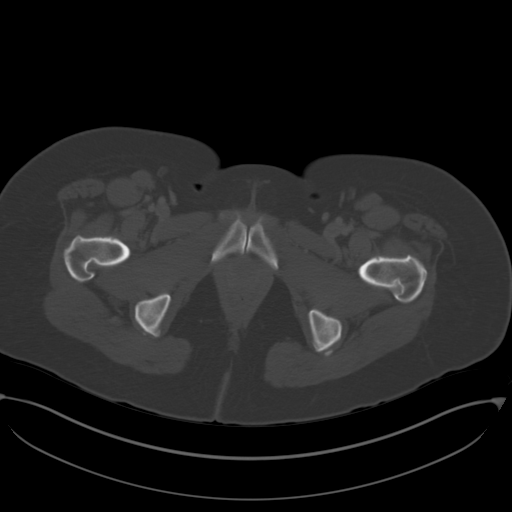
[im 15/97  soft-tissue]
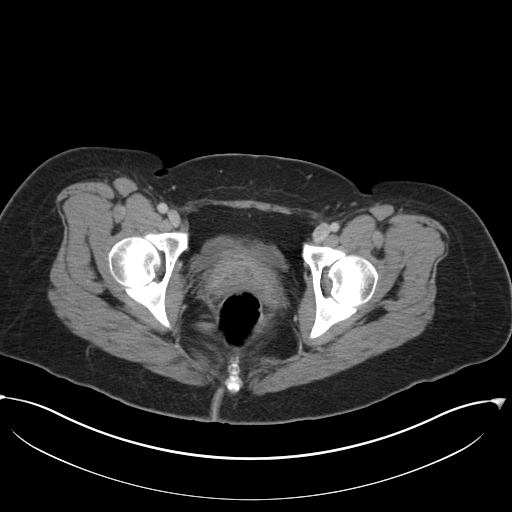
[im 23/97  soft-tissue]
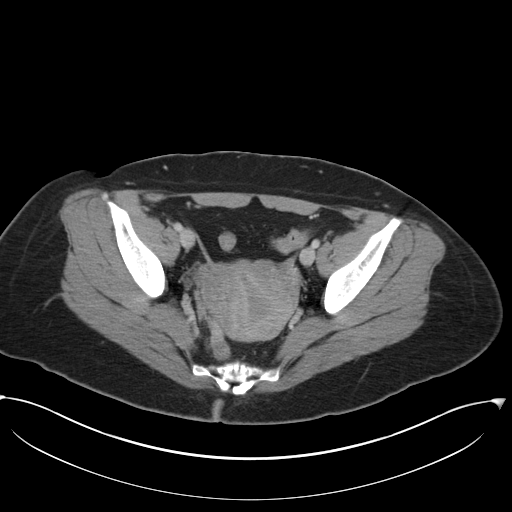
[im 30/97  soft-tissue]
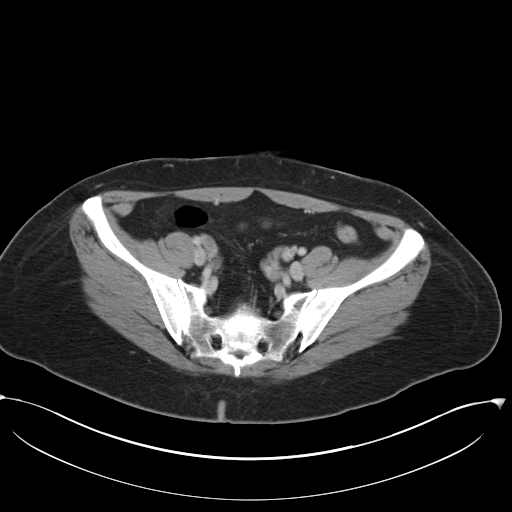
[im 37/97  soft-tissue]
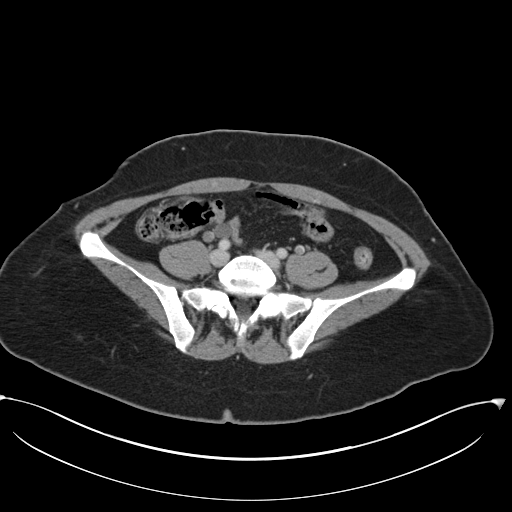
[im 45/97  soft-tissue]
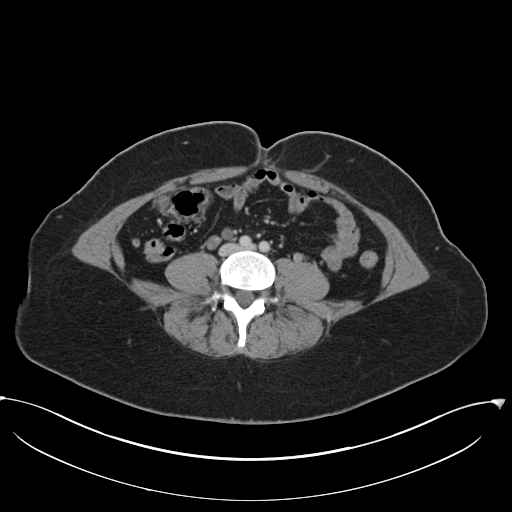
[im 52/97  soft-tissue]
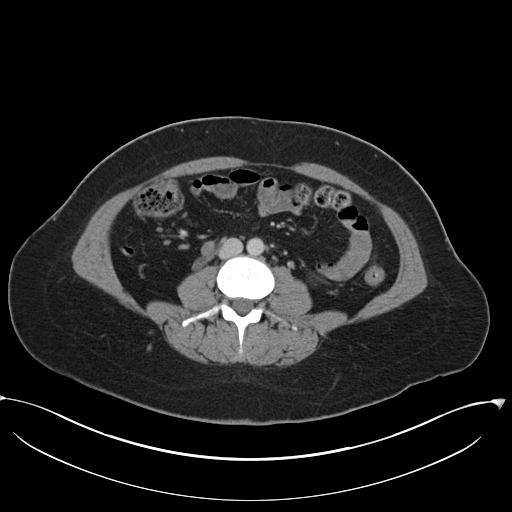
[im 60/97  soft-tissue]
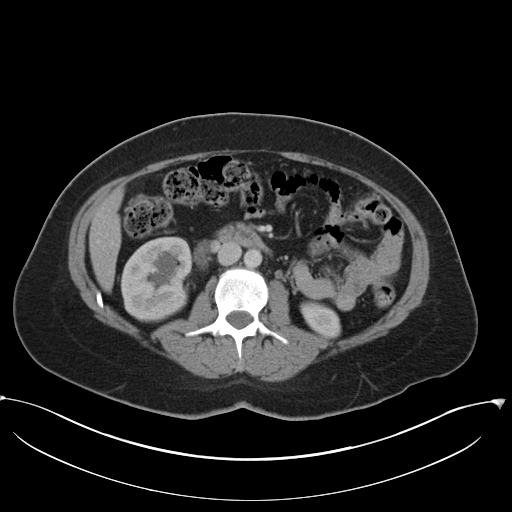
[im 67/97  soft-tissue]
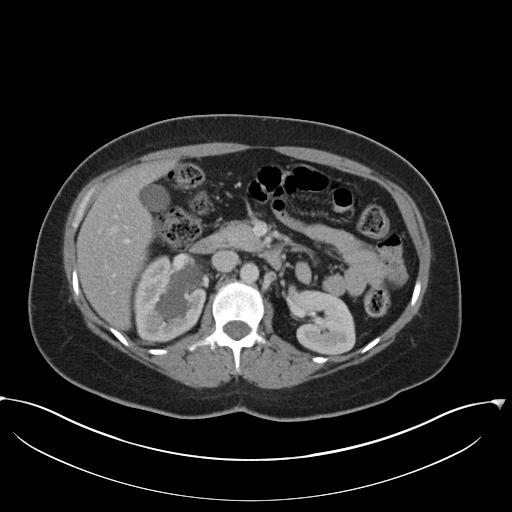
[im 67/97  bone]
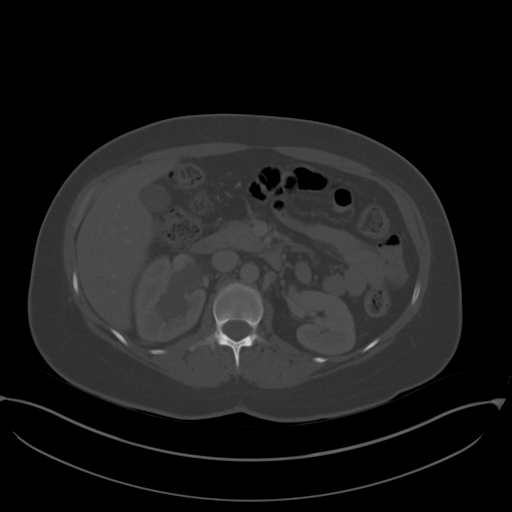
[im 74/97  soft-tissue]
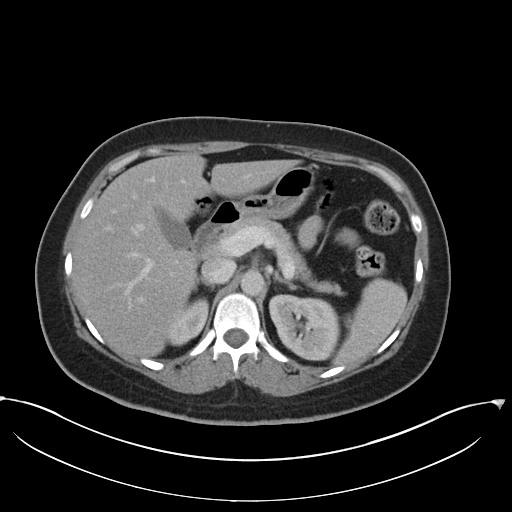
[im 82/97  soft-tissue]
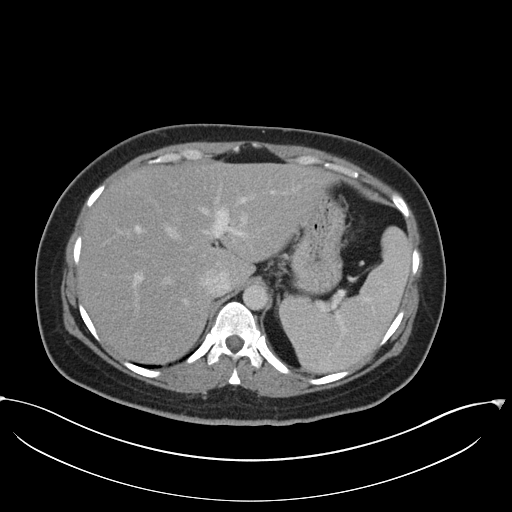
[im 89/97  soft-tissue]
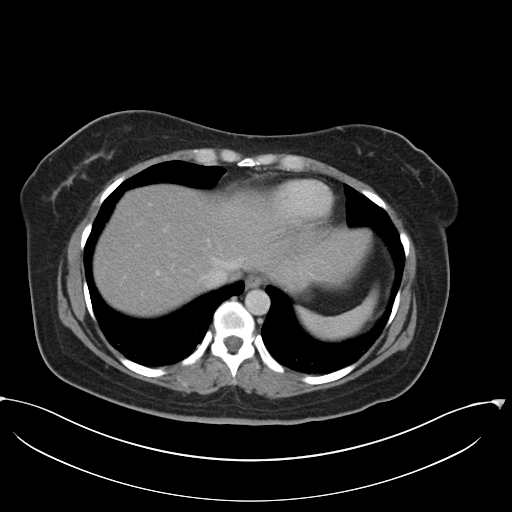

[Series 3: coronal a/|p · coronal · 0.74mm/px · 3 of 167 slices shown]
[im 56/167  soft-tissue]
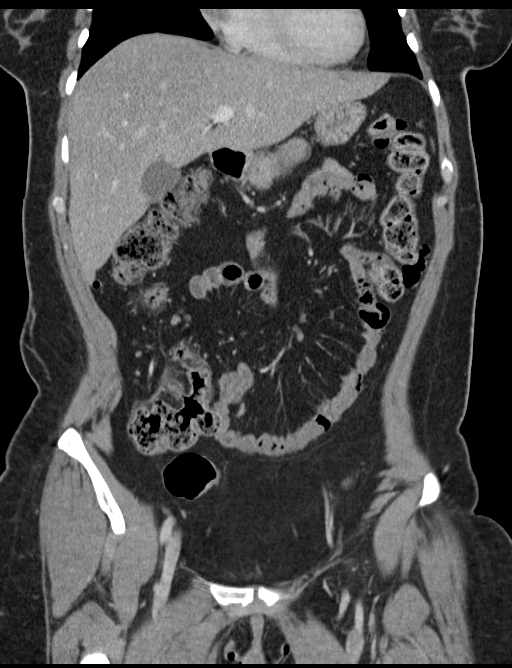
[im 74/167  soft-tissue]
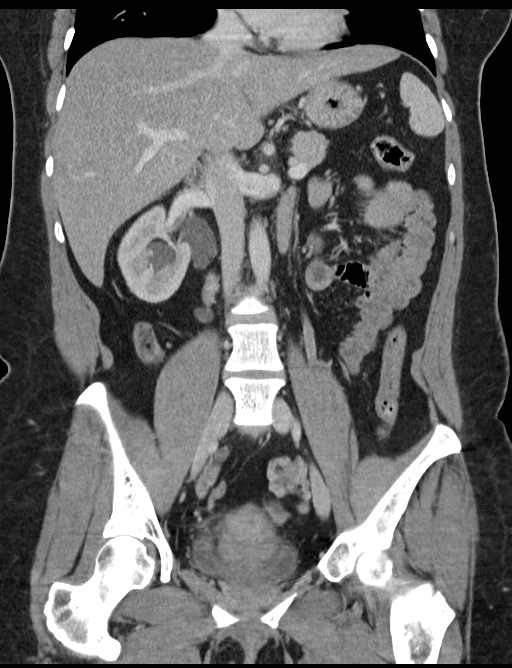
[im 93/167  soft-tissue]
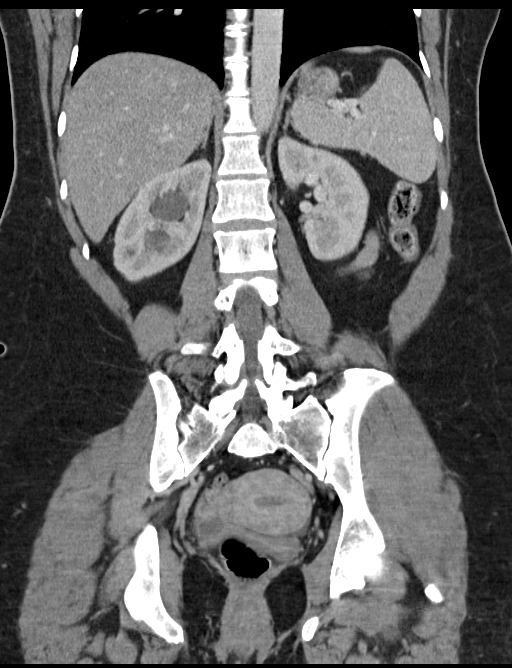

[15 of 46 positions shown; findings below may reference images not displayed]

FINDINGS: Lower chest: Minimal bibasilar atelectasis is noted. The visualized
portions of the mediastinum are unremarkable

Hepatobiliary: The liver is unremarkable in appearance. The
gallbladder is unremarkable in appearance. The common bile duct
remains normal in caliber.

Pancreas: The pancreas is within normal limits.

Spleen: The spleen is unremarkable in appearance.

Adrenals/Urinary Tract: The adrenal glands are unremarkable in
appearance.

There is moderate to severe right-sided hydronephrosis, with diffuse
dilatation of the right ureter. This reflects a large obstructing 9
x 6 mm stone at the distal right ureter, just above the right
vesicoureteral junction.

No perinephric stranding is seen. No nonobstructing renal stones are
identified. The left kidney is unremarkable in appearance.

Stomach/Bowel: The stomach is unremarkable in appearance. The small
bowel is within normal limits. The appendix is normal in caliber,
without evidence of appendicitis. The colon is unremarkable in
appearance.

Vascular/Lymphatic: The abdominal aorta is unremarkable in
appearance. The inferior vena cava is grossly unremarkable. No
retroperitoneal lymphadenopathy is seen. No pelvic sidewall
lymphadenopathy is identified.

Reproductive: The bladder is decompressed and not well assessed.

The uterus is unremarkable in appearance. The ovaries are relatively
symmetric. No suspicious adnexal masses are seen.

Other: No additional soft tissue abnormalities are seen.

Musculoskeletal: No acute osseous abnormalities are identified. The
visualized musculature is unremarkable in appearance.
IMPRESSION: Moderate to severe right-sided hydronephrosis, with diffuse
dilatation of the right ureter. This reflects a large obstructing 9
x 6 mm stone at the distal right ureter, just above the right
vesicoureteral junction.

## 2018-07-15 IMAGING — DX DG LUMBAR SPINE 2-3V
3 series · 3 of 3 positions shown · non-contrast
Comparison: None.

CLINICAL DATA: Low back pain, no known injury, initial encounter

EXAM:
LUMBAR SPINE - 3 VIEW

[l-spine ap]
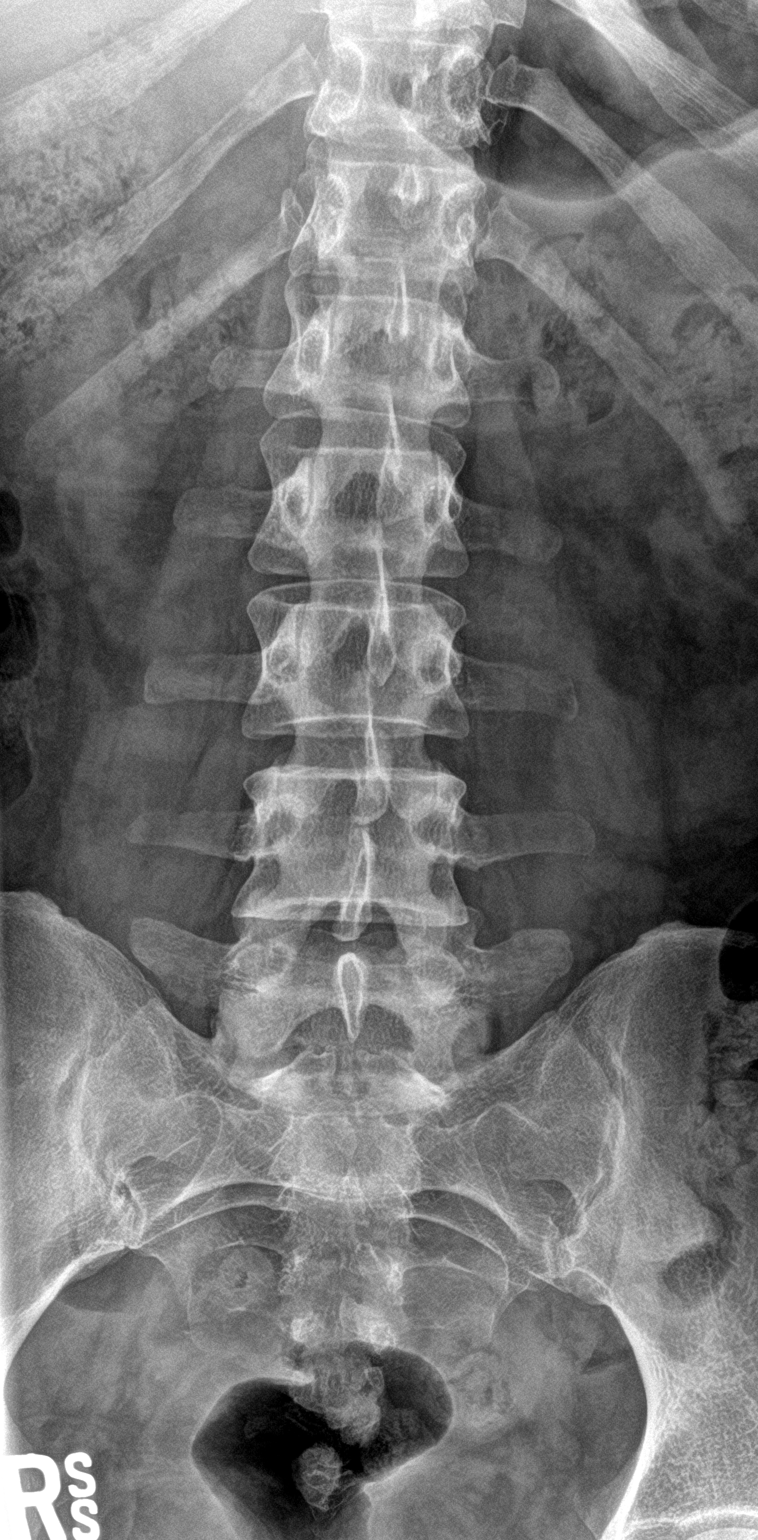

[l-spine lat]
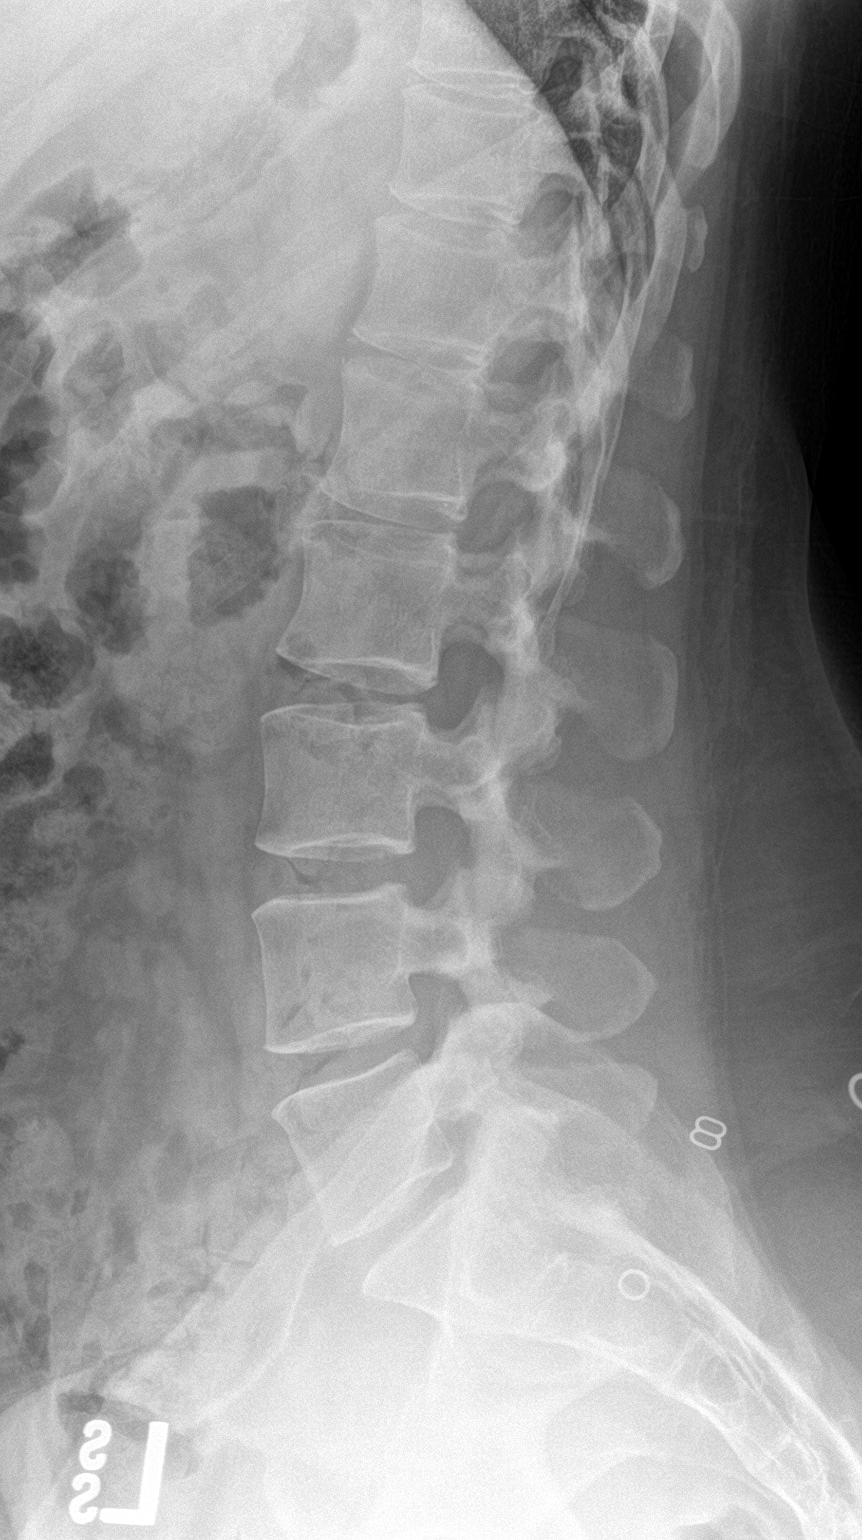

[l-spine spot]
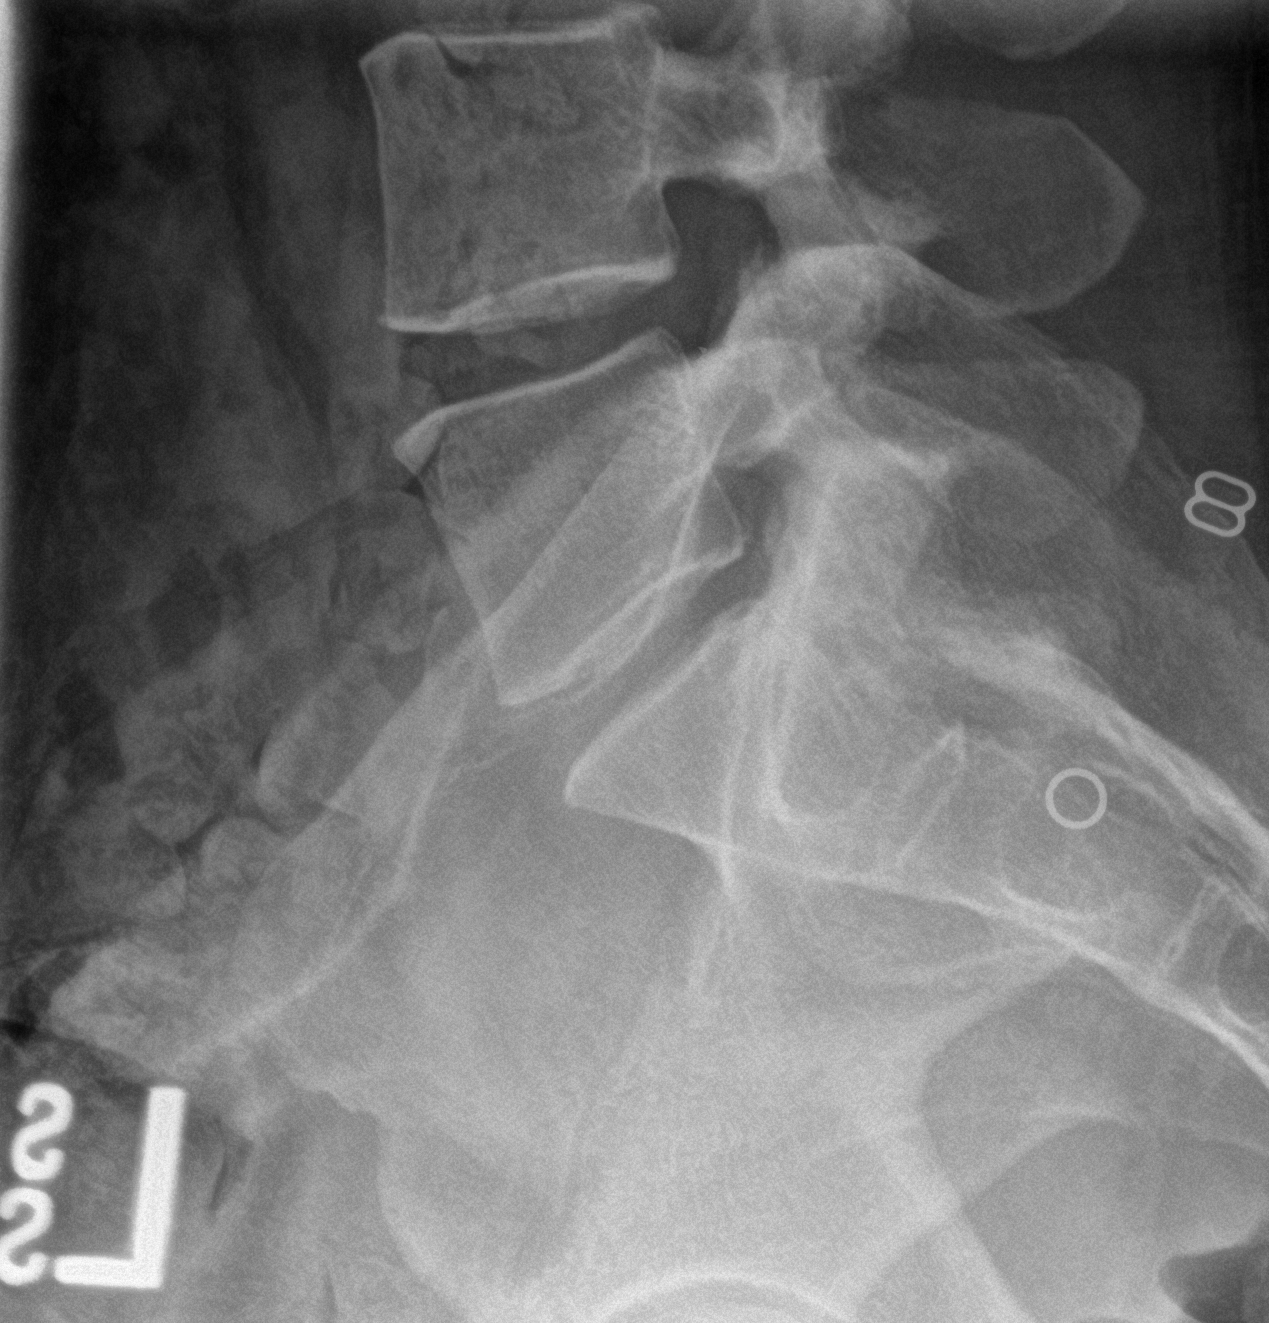

[3 of 3 positions shown; findings below may reference images not displayed]

FINDINGS: Five lumbar type vertebral bodies are well visualized. Vertebral
body height is well maintained. No anterolisthesis is seen. No soft
tissue abnormality is noted.
IMPRESSION: No acute abnormality seen.
